# Patient Record
Sex: Male | Born: 1995 | Race: Black or African American | Hispanic: No | Marital: Single | Smoking: Never smoker
Health system: Southern US, Community
[De-identification: ages and names within clinical notes are randomized; demographics above are authoritative.]

## PROBLEM LIST (undated history)

## (undated) DIAGNOSIS — R41840 Attention and concentration deficit: Secondary | ICD-10-CM

---
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
## 2019-09-19 ENCOUNTER — Ambulatory Visit
Admission: EM | Admit: 2019-09-19 | Discharge: 2019-09-19 | Disposition: A | Discharge status: Home / Self Care | Payer: Medicaid Other

## 2019-09-19 ENCOUNTER — Encounter: Payer: Self-pay | Admitting: Emergency Medicine

## 2019-09-19 ENCOUNTER — Other Ambulatory Visit: Payer: Self-pay

## 2019-09-19 DIAGNOSIS — M545 Low back pain, unspecified: Secondary | ICD-10-CM

## 2019-09-19 DIAGNOSIS — W010XXA Fall on same level from slipping, tripping and stumbling without subsequent striking against object, initial encounter: Secondary | ICD-10-CM

## 2019-09-19 NOTE — Discharge Instructions (Addendum)
Recommend RICE: rest, ice, compression, elevation as needed for pain.   Heat therapy (hot compress, warm wash red, hot showers, etc.) can help relax muscles and soothe muscle aches. Cold therapy (ice packs) can be used to help swelling both after injury and after prolonged use of areas of chronic pain/aches.  For pain: recommend 350 mg-1000 mg of Tylenol (acetaminophen) and/or 200 mg - 800 mg of Advil (ibuprofen, Motrin) every 8 hours as needed.  May alternate between the two throughout the day as they are generally safe to take together.  DO NOT exceed more than 3000 mg of Tylenol or 3200 mg of ibuprofen in a 24 hour period as this could damage your stomach, kidneys, liver, or increase your bleeding risk.  Return for worsening pain, bathing suit area numbness, change in bowel or bladder habit, difficulty walking, fever.

## 2019-09-19 NOTE — ED Triage Notes (Addendum)
Pt presents to [facility] after slipping on ice and did a split with the left leg forward.  Patient states his bilateral lower back now hurts.  Patient states he called out of work due to the back pain, and is now needing a note stating he is safe to return.

## 2019-09-19 NOTE — ED Provider Notes (Signed)
[facility]    CSN: [id number] Arrival date & time: 09/19/19  0845      History   Chief Complaint Chief Complaint  Patient presents with  . Back Pain    HPI Stephen Holloway is a 24 y.o. male presenting for midline low back pain since fall this morning.  Patient states around 2 AM he slipped on "motor something "and did a split with his left leg forward.  Patient states initially there is not much pain, though he had more soreness when waking up.  Denies radiation of pain, saddle anesthesia, change in bowel or bladder habit.  No leg pain, hip pain, neck pain, head trauma, LOC.  Has not tried thing for this is "does not bug me that bad ".  Patient does state that pain is worse with movement, though just sitting in office: "I'm fine ".  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   Past Medical History:  Diagnosis Date  . Attention deficit     There are no problems to display for this patient.   History reviewed. No pertinent surgical history.     Home Medications    Prior to Admission medications   Medication Sig Start Date End Date Taking? Authorizing Provider  cetirizine (ZYRTEC) 10 MG tablet Take 10 mg by mouth daily as needed for allergies.    [provider]    Family History Family History  Family history unknown: Yes    Social History Social History   Tobacco Use  . Smoking status: Never Smoker  . Smokeless tobacco: Never Used  Substance Use Topics  . Alcohol use: Not Currently  . Drug use: Not on file     Allergies   Dust mite extract   Review of Systems As per HPI   Physical Exam Triage Vital Signs ED Triage Vitals  Enc Vitals Group     BP 09/19/19 0856 118/81     Pulse Rate 09/19/19 0856 81     Resp 09/19/19 0856 18     Temp 09/19/19 0856 98.2 F (36.8 C)     Temp Source 09/19/19 0856 Temporal   SpO2 09/19/19 0856 95 %     Weight --      Height --      Head Circumference --      Peak Flow --      Pain Score 09/19/19 0858 5     Pain Loc --      Pain Edu? --      Excl. in [place]? --    No data found.  Updated Vital Signs BP 118/81 (BP Location: Left Arm)   Pulse 81   Temp 98.2 F (36.8 C) (Temporal)   Resp 18   SpO2 95%   Visual Acuity Right Eye Distance:   Left Eye Distance:   Bilateral Distance:    Right Eye Near:   Left Eye Near:    Bilateral Near:     Physical Exam Constitutional:      General: He is not in acute distress. HENT:     Head: Normocephalic and atraumatic.  Eyes:     General: No scleral icterus.    Pupils: Pupils are equal, round, and reactive to light.  Cardiovascular:     Rate and Rhythm: Normal rate.  Pulmonary:     Effort: Pulmonary effort is normal. No respiratory distress.  Breath sounds: No wheezing.  Musculoskeletal:     Lumbar back: Swelling present. No deformity, signs of trauma, lacerations, spasms, tenderness or bony tenderness. Normal range of motion. Negative right straight leg raise test and negative left straight leg raise test. No scoliosis.       Back:     Comments: No PSIS tenderness bilaterally  Skin:    Coloration: Skin is not jaundiced or pale.     Findings: No bruising or rash.  Neurological:     Mental Status: He is alert and oriented to person, place, and time.     Cranial Nerves: No cranial nerve deficit.     Sensory: No sensory deficit.     Motor: No weakness.     Coordination: Coordination normal.     Gait: Gait normal.     Deep Tendon Reflexes: Reflexes normal.      UC Treatments / Results  Labs (all labs ordered are listed, but only abnormal results are displayed) Labs Reviewed - No data to display  EKG   Radiology No results found.  Procedures Procedures (including critical care time)  Medications Ordered in UC Medications - No data to display  Initial Impression / Assessment and Plan /  UC Course  I have reviewed the triage vital signs and the nursing notes.  Pertinent labs & imaging results that were available during my care of the patient were reviewed by me and considered in my medical decision making (see chart for details).     Patient appears well in office without significant findings on exam today.  Will trial RICE/supportive measures as outlined below.  Work note provided.  Return precautions discussed, patient verbalized understanding and is agreeable to plan. Final Clinical Impressions(s) / UC Diagnoses   Final diagnoses:  Acute midline low back pain without sciatica     Discharge Instructions     Recommend RICE: rest, ice, compression, elevation as needed for pain.   Heat therapy (hot compress, warm wash red, hot showers, etc.) can help relax muscles and soothe muscle aches. Cold therapy (ice packs) can be used to help swelling both after injury and after prolonged use of areas of chronic pain/aches.  For pain: recommend 350 mg-1000 mg of Tylenol (acetaminophen) and/or 200 mg - 800 mg of Advil (ibuprofen, Motrin) every 8 hours as needed.  May alternate between the two throughout the day as they are generally safe to take together.  DO NOT exceed more than 3000 mg of Tylenol or 3200 mg of ibuprofen in a 24 hour period as this could damage your stomach, kidneys, liver, or increase your bleeding risk.  Return for worsening pain, bathing suit area numbness, change in bowel or bladder habit, difficulty walking, fever.    ED Prescriptions    None     PDMP not reviewed this encounter.   Neldon Mc [place], [place] 09/19/19 [phone]

## 2019-10-18 ENCOUNTER — Ambulatory Visit
Admission: EM | Admit: 2019-10-18 | Discharge: 2019-10-18 | Disposition: A | Discharge status: Home / Self Care | Payer: Medicaid Other | Attending: Physician Assistant | Admitting: Physician Assistant

## 2019-10-18 DIAGNOSIS — Z113 Encounter for screening for infections with a predominantly sexual mode of transmission: Secondary | ICD-10-CM | POA: Insufficient documentation

## 2019-10-18 NOTE — Discharge Instructions (Signed)
Keep hydrated, urine should be clear to pale yellow in color. Cytology sent, you will be contacted with any positive results that requires further treatment. Follow up with PCP for further evaluation needed.

## 2019-10-18 NOTE — ED Triage Notes (Signed)
Pt states had a red ring around head of penis for over a year and also having dark urine. Denies un protective intercourse, states has had un protective oral. Denies discharge or pain.

## 2019-10-18 NOTE — ED Provider Notes (Signed)
[facility]    CSN: [id number] Arrival date & time: 10/18/19  1025      History   Chief Complaint Chief Complaint  Patient presents with  . SEXUALLY TRANSMITTED DISEASE    HPI Stephen Holloway is a 24 y.o. male.   24 year old male comes in for STD testing.  States it was "time to get checked".  Denies urinary symptoms such as frequency, dysuria, hematuria.  States had noticed dark urine that resolves after drinking water.  Denies penile discharge, testicular swelling, testicular pain.  Denies penile ulceration, though states has had "red ring" around the glans of the penis for the past year that is intermittent.  Denies any itching or pain.  Denies drainage.  Denies fever, chills, body aches.  He is not currently sexually active, states was last sexually active a few months ago, at the time with 1 male partner, with consistent condom use.     Past Medical History:  Diagnosis Date  . Attention deficit     There are no problems to display for this patient.   History reviewed. No pertinent surgical history.     Home Medications    Prior to Admission medications   Not on File    Family History Family History  Family history unknown: Yes    Social History Social History   Tobacco Use  . Smoking status: Never Smoker  . Smokeless tobacco: Never Used  Substance Use Topics  . Alcohol use: Yes    Comment: occ  . Drug use: Not Currently     Allergies   Dust mite extract   Review of Systems Review of Systems  Reason unable to perform ROS: See HPI as above.     Physical Exam Triage Vital Signs ED Triage Vitals  Enc Vitals Group     BP 10/18/19 1007 120/83     Pulse Rate 10/18/19 1007 78     Resp 10/18/19 1007 16     Temp 10/18/19 1007 98.4 F (36.9 C)     Temp Source 10/18/19 1007 Oral     SpO2 10/18/19 1007 99 %     Weight --      Height --      Head Circumference --      Peak Flow --      Pain Score 10/18/19 1010 0     Pain Loc  --      Pain Edu? --      Excl. in [place]? --    No data found.  Updated Vital Signs BP 120/83 (BP Location: Left Arm)   Pulse 78   Temp 98.4 F (36.9 C) (Oral)   Resp 16   SpO2 99%   Physical Exam Exam conducted with a chaperone present.  Constitutional:      General: He is not in acute distress.    Appearance: Normal appearance. He is well-developed. He is not toxic-appearing or diaphoretic.  HENT:     Head: Normocephalic and atraumatic.  Eyes:     Conjunctiva/sclera: Conjunctivae normal.     Pupils: Pupils are equal, round, and reactive to light.  Pulmonary:     Effort: Pulmonary effort is normal. No respiratory distress.     Comments: Speaking in full sentences without difficulty Genitourinary:    Penis: Circumcised.      Comments: Small portion of foreskin overlapping base of glans. Foreskin able to be retracted fully. Mild erythema around the base of glans without swelling, pain. No tenderness to palpation. No  discharge. No open wounds. Musculoskeletal:     Cervical back: Normal range of motion and neck supple.  Skin:    General: Skin is warm and dry.  Neurological:     Mental Status: He is alert and oriented to person, place, and time.      UC Treatments / Results  Labs (all labs ordered are listed, but only abnormal results are displayed) Labs Reviewed  CYTOLOGY, (ORAL, ANAL, URETHRAL) ANCILLARY ONLY    EKG   Radiology No results found.  Procedures Procedures (including critical care time)  Medications Ordered in UC Medications - No data to display  Initial Impression / Assessment and Plan / UC Course  I have reviewed the triage vital signs and the nursing notes.  Pertinent labs & imaging results that were available during my care of the patient were reviewed by me and considered in my medical decision making (see chart for details).    ?irritation/mild balanitis as patient with small residual foreskin that covers the base of glans. However, no  signs of obvious bacterial or fungal infection at this time. Discussed to increase hygiene and follow up with PCP/urology if symptoms still not improving.  Cytology sent, patient will be contacted with any positive results that require additional treatment. Patient to refrain from sexual activity for the next 7 days. Return precautions given.   Final Clinical Impressions(s) / UC Diagnoses   Final diagnoses:  Screen for STD (sexually transmitted disease)   ED Prescriptions    None     PDMP not reviewed this encounter.   Belinda Fisher, PA-C 10/18/19 1332

## 2019-10-19 LAB — CYTOLOGY, (ORAL, ANAL, URETHRAL) ANCILLARY ONLY
Chlamydia: NEGATIVE
Neisseria Gonorrhea: NEGATIVE
Trichomonas: NEGATIVE

## 2020-05-06 ENCOUNTER — Ambulatory Visit
Admission: EM | Admit: 2020-05-06 | Discharge: 2020-05-06 | Disposition: A | Discharge status: Home / Self Care | Payer: Medicaid Other

## 2020-05-06 ENCOUNTER — Other Ambulatory Visit: Payer: Self-pay

## 2020-05-06 DIAGNOSIS — R21 Rash and other nonspecific skin eruption: Secondary | ICD-10-CM

## 2020-05-06 NOTE — ED Provider Notes (Signed)
[facility]    CSN: [id number] Arrival date & time: 05/06/20  0956      History   Chief Complaint Chief Complaint  Patient presents with  . penile sore    HPI Earnie Bechard is a 24 y.o. male.   24 year old male comes in for rash noted to the penis yesterday. Was seen for similar in the past. No pain, itching, irritation.Denies fever, chills, body aches. Denies abdominal pain, nausea, vomiting. Denies urinary symptoms such as frequency, dysuria, hematuria. Denies penile discharge, penile sore/ulcer, testicular swelling, testicular pain. Not currently sexually active. Had oral intercourse 3-4 months ago. Otherwise no activity since last STD check.     Past Medical History:  Diagnosis Date  . Attention deficit     There are no problems to display for this patient.   History reviewed. No pertinent surgical history.     Home Medications    Prior to Admission medications   Not on File    Family History Family History  Family history unknown: Yes    Social History Social History   Tobacco Use  . Smoking status: Never Smoker  . Smokeless tobacco: Never Used  Substance Use Topics  . Alcohol use: Yes    Comment: occ  . Drug use: Not Currently     Allergies   Dust mite extract   Review of Systems Review of Systems  Reason unable to perform ROS: See HPI as above.     Physical Exam Triage Vital Signs ED Triage Vitals  Enc Vitals Group     BP 05/06/20 1128 126/85     Pulse Rate 05/06/20 1128 73     Resp 05/06/20 1128 16     Temp 05/06/20 1128 98.4 F (36.9 C)     Temp Source 05/06/20 1128 Oral     SpO2 05/06/20 1128 98 %     Weight --      Height --      Head Circumference --      Peak Flow --      Pain Score 05/06/20 1133 0     Pain Loc --      Pain Edu? --      Excl. in GC? --    No data found.  Updated Vital Signs BP 126/85 (BP Location: Left Arm)   Pulse 73   Temp 98.4 F (36.9 C) (Oral)   Resp 16   SpO2 98%    Physical Exam Exam conducted with a chaperone present.  Constitutional:      General: He is not in acute distress.    Appearance: Normal appearance. He is well-developed. He is not toxic-appearing or diaphoretic.  HENT:     Head: Normocephalic and atraumatic.  Eyes:     Conjunctiva/sclera: Conjunctivae normal.     Pupils: Pupils are equal, round, and reactive to light.  Pulmonary:     Effort: Pulmonary effort is normal. No respiratory distress.  Genitourinary:    Comments: No significant rash seen. Some irritation to base of glans. No foreskin swelling. No penile discharge.  Musculoskeletal:     Cervical back: Normal range of motion and neck supple.  Skin:    General: Skin is warm and dry.  Neurological:     Mental Status: He is alert and oriented to person, place, and time.      UC Treatments / Results  Labs (all labs ordered are listed, but only abnormal results are displayed) Labs Reviewed - No data to  display  EKG   Radiology No results found.  Procedures Procedures (including critical care time)  Medications Ordered in UC Medications - No data to display  Initial Impression / Assessment and Plan / UC Course  I have reviewed the triage vital signs and the nursing notes.  Pertinent labs & imaging results that were available during my care of the patient were reviewed by me and considered in my medical decision making (see chart for details).    No ulceration on exam. No signs of bacterial/funcal infection. Follow up with PCP for recheck if symptoms not improving. Return precautions given.  Final Clinical Impressions(s) / UC Diagnoses   Final diagnoses:  Penile rash    ED Prescriptions    None     PDMP not reviewed this encounter.   Belinda Fisher, PA-C 05/06/20 [phone]

## 2020-05-06 NOTE — ED Triage Notes (Signed)
Pt c/o red with black center sore to head of penis since yesterday. Denies penile discharge, painful urination, fever, chills, n/v, abdominal pain.

## 2020-05-06 NOTE — Discharge Instructions (Signed)
No alarming signs. Follow up with PCP if symptoms not improving.

## 2021-12-07 ENCOUNTER — Ambulatory Visit
Admission: EM | Admit: 2021-12-07 | Discharge: 2021-12-07 | Disposition: A | Discharge status: Home / Self Care | Payer: Medicaid Other | Attending: Student | Admitting: Student

## 2021-12-07 DIAGNOSIS — R399 Unspecified symptoms and signs involving the genitourinary system: Secondary | ICD-10-CM | POA: Insufficient documentation

## 2021-12-07 DIAGNOSIS — Z113 Encounter for screening for infections with a predominantly sexual mode of transmission: Secondary | ICD-10-CM | POA: Insufficient documentation

## 2021-12-07 LAB — POCT URINALYSIS DIP (MANUAL ENTRY)
Bilirubin, UA: NEGATIVE
Blood, UA: NEGATIVE
Glucose, UA: NEGATIVE mg/dL
Ketones, POC UA: NEGATIVE mg/dL
Leukocytes, UA: NEGATIVE
Nitrite, UA: NEGATIVE
Protein Ur, POC: NEGATIVE mg/dL
Spec Grav, UA: 1.01 (ref 1.010–1.025)
Urobilinogen, UA: 0.2 E.U./dL
pH, UA: 6.5 (ref 5.0–8.0)

## 2021-12-07 NOTE — ED Provider Notes (Signed)
?[facility] ? ? ? ?CSN: [id number] ?Arrival date & time: 12/07/21  1132 ? ? ?  ? ?History   ?Chief Complaint ?Chief Complaint  ?Patient presents with  ? urine odor  ? ? ?HPI ?Mervil Wacker is a 26 y.o. male presenting with urinary symptoms for 2 years.  History noncontributory.  Describes intermittent urinary odor, dysuria, suprapubic pressure, weak stream.  Denies hematuria, flank pain, fever/chills, penile discharge, penile or testicular rash or lesion.  Denies STI risk.  States symptoms have not changed, but he waited 2 years to come in and today wanted to make sure that he did not have a UTI. Only symptom today is suprapubic pressure.  ? ?HPI ? ?Past Medical History:  ?Diagnosis Date  ? Attention deficit   ? ? ?There are no problems to display for this patient. ? ? ?History reviewed. No pertinent surgical history. ? ? ? ? ?Home Medications   ? ?Prior to Admission medications   ?Not on File  ? ? ?Family History ?Family History  ?Family history unknown: Yes  ? ? ?Social History ?Social History  ? ?Tobacco Use  ? Smoking status: Never  ? Smokeless tobacco: Never  ?Substance Use Topics  ? Alcohol use: Yes  ?  Comment: occ  ? Drug use: Not Currently  ? ? ? ?Allergies   ?Dust mite extract ? ? ?Review of Systems ?Review of Systems  ?Constitutional:  Negative for appetite change, chills, diaphoresis and fever.  ?Respiratory:  Negative for shortness of breath.   ?Cardiovascular:  Negative for chest pain.  ?Gastrointestinal:  Positive for abdominal pain. Negative for blood in stool, constipation, diarrhea, nausea and vomiting.  ?Genitourinary:  Positive for dysuria and frequency. Negative for decreased urine volume, difficulty urinating, flank pain, genital sores, hematuria and urgency.  ?Musculoskeletal:  Negative for back pain.  ?Neurological:  Negative for dizziness, weakness and light-headedness.  ?All other systems reviewed and are negative. ? ? ?Physical Exam ?Triage Vital Signs ?ED Triage Vitals  ?Enc  Vitals Group  ?   BP 12/07/21 1302 123/72  ?   Pulse Rate 12/07/21 1302 77  ?   Resp 12/07/21 1302 18  ?   Temp 12/07/21 1302 98 ?F (36.7 ?C)  ?   Temp Source 12/07/21 1302 Oral  ?   SpO2 12/07/21 1302 96 %  ?   Weight --   ?   Height --   ?   Head Circumference --   ?   Peak Flow --   ?   Pain Score 12/07/21 1303 0  ?   Pain Loc --   ?   Pain Edu? --   ?   Excl. in GC? --   ? ?No data found. ? ?Updated Vital Signs ?BP 123/72 (BP Location: Left Arm)   Pulse 77   Temp 98 ?F (36.7 ?C) (Oral)   Resp 18   SpO2 96%  ? ?Visual Acuity ?Right Eye Distance:   ?Left Eye Distance:   ?Bilateral Distance:   ? ?Right Eye Near:   ?Left Eye Near:    ?Bilateral Near:    ? ?Physical Exam ?Vitals reviewed.  ?Constitutional:   ?   General: He is not in acute distress. ?   Appearance: Normal appearance. He is obese. He is not ill-appearing.  ?HENT:  ?   Head: Normocephalic and atraumatic.  ?   Mouth/Throat:  ?   Mouth: Mucous membranes are moist.  ?   Comments: Moist mucous membranes ?Eyes:  ?  Extraocular Movements: Extraocular movements intact.  ?   Pupils: Pupils are equal, round, and reactive to light.  ?Cardiovascular:  ?   Rate and Rhythm: Normal rate and regular rhythm.  ?   Heart sounds: Normal heart sounds.  ?Pulmonary:  ?   Effort: Pulmonary effort is normal.  ?   Breath sounds: Normal breath sounds. No wheezing, rhonchi or rales.  ?Abdominal:  ?   General: Bowel sounds are normal. There is no distension.  ?   Palpations: Abdomen is soft. There is no mass.  ?   Tenderness: There is no abdominal tenderness. There is no right CVA tenderness, left CVA tenderness, guarding or rebound.  ?   Comments: No reproducible pain  ?Skin: ?   General: Skin is warm.  ?   Capillary Refill: Capillary refill takes less than 2 seconds.  ?   Comments: Good skin turgor  ?Neurological:  ?   General: No focal deficit present.  ?   Mental Status: He is alert and oriented to person, place, and time.  ?Psychiatric:     ?   Mood and Affect: Mood  normal.     ?   Behavior: Behavior normal.  ? ? ? ?UC Treatments / Results  ?Labs ?(all labs ordered are listed, but only abnormal results are displayed) ?Labs Reviewed  ?POCT URINALYSIS DIP (MANUAL ENTRY)  ?CYTOLOGY, (ORAL, ANAL, URETHRAL) ANCILLARY ONLY  ? ? ?EKG ? ? ?Radiology ?No results found. ? ?Procedures ?Procedures (including critical care time) ? ?Medications Ordered in UC ?Medications - No data to display ? ?Initial Impression / Assessment and Plan / UC Course  ?I have reviewed the triage vital signs and the nursing notes. ? ?Pertinent labs & imaging results that were available during my care of the patient were reviewed by me and considered in my medical decision making (see chart for details). ? ?  ? ?This patient is a very pleasant 26 y.o. year old male presenting with urinary symptoms intermittently x2 years. Afebrile, nontachycardic, no reproducible abd pain or CVAT. ? ?UA wnl did not send culture.  ? ?Will send self-swab for G/C, trich. Declines HIV, RPR. Safe sex precautions.  ? ?F/u with urology if symptoms persist.  ? ?ED return precautions discussed. Patient verbalizes understanding and agreement.  ? ? ? ?Final Clinical Impressions(s) / UC Diagnoses  ? ?Final diagnoses:  ?Urinary symptom or sign  ?Routine screening for STI (sexually transmitted infection)  ? ? ? ?Discharge Instructions   ? ?  ?-Declines AVS ? ? ?ED Prescriptions   ?None ?  ? ?PDMP not reviewed this encounter. ?  ?Rhys Martini, PA-C ?12/07/21 1354 ? ?

## 2021-12-07 NOTE — ED Triage Notes (Signed)
Pt c/o urine odor, dysuria, pelvic pain (resolved), oliguria, frequency, urgency ? ?Denies hematuria, lower back pain,  ? ?Onset ~ 2 years ago  ?

## 2021-12-07 NOTE — Discharge Instructions (Addendum)
Declines AVS 

## 2021-12-10 LAB — CYTOLOGY, (ORAL, ANAL, URETHRAL) ANCILLARY ONLY
Chlamydia: NEGATIVE
Comment: NEGATIVE
Comment: NEGATIVE
Comment: NORMAL
Neisseria Gonorrhea: NEGATIVE
Trichomonas: NEGATIVE
